# Patient Record
Sex: Female | Born: 2000
Health system: Southern US, Community
[De-identification: ages and names within clinical notes are randomized; demographics above are authoritative.]

## PROBLEM LIST (undated history)

## (undated) DIAGNOSIS — L309 Dermatitis, unspecified: Secondary | ICD-10-CM

## (undated) DIAGNOSIS — T7840XA Allergy, unspecified, initial encounter: Secondary | ICD-10-CM

## (undated) HISTORY — DX: Dermatitis, unspecified: L30.9

## (undated) HISTORY — DX: Allergy, unspecified, initial encounter: T78.40XA

---
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
## 2022-01-12 ENCOUNTER — Other Ambulatory Visit (HOSPITAL_COMMUNITY): Payer: Self-pay

## 2022-01-16 DIAGNOSIS — Z419 Encounter for procedure for purposes other than remedying health state, unspecified: Secondary | ICD-10-CM | POA: Diagnosis not present

## 2022-02-16 DIAGNOSIS — Z419 Encounter for procedure for purposes other than remedying health state, unspecified: Secondary | ICD-10-CM | POA: Diagnosis not present

## 2022-03-19 DIAGNOSIS — Z419 Encounter for procedure for purposes other than remedying health state, unspecified: Secondary | ICD-10-CM | POA: Diagnosis not present

## 2022-04-18 DIAGNOSIS — Z419 Encounter for procedure for purposes other than remedying health state, unspecified: Secondary | ICD-10-CM | POA: Diagnosis not present

## 2022-05-19 DIAGNOSIS — Z419 Encounter for procedure for purposes other than remedying health state, unspecified: Secondary | ICD-10-CM | POA: Diagnosis not present

## 2022-06-02 ENCOUNTER — Other Ambulatory Visit: Payer: Self-pay

## 2022-06-02 ENCOUNTER — Encounter (HOSPITAL_BASED_OUTPATIENT_CLINIC_OR_DEPARTMENT_OTHER): Payer: Self-pay

## 2022-06-02 ENCOUNTER — Other Ambulatory Visit (HOSPITAL_COMMUNITY): Payer: Self-pay

## 2022-06-02 ENCOUNTER — Emergency Department (HOSPITAL_BASED_OUTPATIENT_CLINIC_OR_DEPARTMENT_OTHER)
Admission: EM | Admit: 2022-06-02 | Discharge: 2022-06-02 | Disposition: A | Discharge status: Home / Self Care | Payer: Medicaid Other | Attending: Emergency Medicine | Admitting: Emergency Medicine

## 2022-06-02 DIAGNOSIS — N760 Acute vaginitis: Secondary | ICD-10-CM | POA: Insufficient documentation

## 2022-06-02 DIAGNOSIS — Z7951 Long term (current) use of inhaled steroids: Secondary | ICD-10-CM | POA: Diagnosis not present

## 2022-06-02 DIAGNOSIS — J45909 Unspecified asthma, uncomplicated: Secondary | ICD-10-CM | POA: Insufficient documentation

## 2022-06-02 DIAGNOSIS — N898 Other specified noninflammatory disorders of vagina: Secondary | ICD-10-CM | POA: Diagnosis present

## 2022-06-02 DIAGNOSIS — B9689 Other specified bacterial agents as the cause of diseases classified elsewhere: Secondary | ICD-10-CM | POA: Diagnosis not present

## 2022-06-02 LAB — WET PREP, GENITAL
Sperm: NONE SEEN
Trich, Wet Prep: NONE SEEN
WBC, Wet Prep HPF POC: >=10 — AB (ref <10)
Yeast Wet Prep HPF POC: NONE SEEN

## 2022-06-02 LAB — URINALYSIS, ROUTINE W REFLEX MICROSCOPIC
Bilirubin Urine: NEGATIVE
Glucose, UA: NEGATIVE mg/dL
Hgb urine dipstick: NEGATIVE
Ketones, ur: NEGATIVE mg/dL
Leukocytes,Ua: NEGATIVE
Nitrite: NEGATIVE
Protein, ur: NEGATIVE mg/dL
Specific Gravity, Urine: 1.025 (ref 1.005–1.030)
pH: 7 (ref 5.0–8.0)

## 2022-06-02 LAB — PREGNANCY, URINE: Preg Test, Ur: NEGATIVE

## 2022-06-02 MED ORDER — METRONIDAZOLE 500 MG PO TABS
500.0000 mg | ORAL_TABLET | Freq: Two times a day (BID) | ORAL | 0 refills | Status: AC
  Filled 2022-06-02: qty 14, 7d supply, fill #0

## 2022-06-02 NOTE — ED Triage Notes (Signed)
Pt reports brown vaginal discharge x 1 week

## 2022-06-02 NOTE — Discharge Instructions (Addendum)
You were seen in the emergency department today for vaginal discharge.  Your urine sample did not show any infection.  However you did test positive for bacterial vaginosis.  This is a very common infection, and it is not sexually transmitted.  It is treated with antibiotics, which I sent to your pharmacy.  Continue to monitor how you're doing and return to the ER for new or worsening symptoms.

## 2022-06-02 NOTE — ED Notes (Signed)
Patient self swabbed

## 2022-06-02 NOTE — ED Provider Notes (Signed)
[facility] EMERGENCY DEPARTMENT Provider Note   CSN: [id number] Arrival date & time: 06/02/22  1125     History  Chief Complaint  Patient presents with   Vaginal Discharge    Sydney Holland is a 21 y.o. adult with history of asthma who presents the emergency department complaining of vaginal discharge x2 weeks.  Patient states that the discharge is brown and thick.  Last menstrual period ended 8 days ago and was normal for them.  Describes some itching, but no vaginal pain, pelvic pain, or abdominal pain.  No urinary symptoms.   Vaginal Discharge      Home Medications Prior to Admission medications   Medication Sig Start Date End Date Taking? Authorizing Provider  metroNIDAZOLE (FLAGYL) 500 MG tablet Take 1 tablet (500 mg total) by mouth 2 (two) times daily. 06/02/22  Yes Kailin Leu T, PA-C  cetirizine (ZYRTEC) 10 MG tablet Take 1 tablet (10 mg total) by mouth at bedtime for allergies 10/17/21   Lucio Edward, MD  ferrous sulfate 325 (65 FE) MG tablet TAKE 1 TABLET BY MOUTH ONCE A DAY 07/17/20 07/17/21  Lucio Edward, MD  fluticasone (FLONASE) 50 MCG/ACT nasal spray Place 2 sprays into the nose daily as needed. For allergies     [provider]  Iron, Ferrous Sulfate, 325 (65 Fe) MG TABS 1 tab by mouth once a day. 07/17/20   Lucio Edward, MD  montelukast (SINGULAIR) 10 MG tablet TAKE 1 TABLET BY MOUTH AT BEDTIME 08/18/20 08/18/21  Lucio Edward, MD  PAZEO 0.7 % SOLN INSTILL 1 DROP IN AFFECTED EYE(S) EVERY DAY AS NEEDED FOR ITCHING 02/26/19   Lucio Edward, MD  PROAIR HFA [phone] Base) MCG/ACT inhaler INHALE 2 PUFFS BY MOUTH EVERY 4 TO 6 HOURS AS NEEDED FOR WHEEZING 08/18/20 08/18/21  Lucio Edward, MD      Allergies    Other, Azithromycin, and Penicillins    Review of Systems   Review of Systems  Genitourinary:  Positive for vaginal discharge.  All other systems reviewed and are negative.   Physical Exam Updated Vital Signs BP 111/62 (BP  Location: Right Arm)   Pulse 82   Temp 98.4 F (36.9 C) (Oral)   Resp 16   Ht 5' (1.524 m)   Wt 56 kg   LMP 05/26/2022   SpO2 100%   BMI 24.10 kg/m  Physical Exam Vitals and nursing note reviewed.  Constitutional:      Appearance: Normal appearance.  HENT:     Head: Normocephalic and atraumatic.  Eyes:     Conjunctiva/sclera: Conjunctivae normal.  Pulmonary:     Effort: Pulmonary effort is normal. No respiratory distress.  Skin:    General: Skin is warm and dry.  Neurological:     Mental Status: Damoni Erker is alert.  Psychiatric:        Mood and Affect: Mood normal.        Behavior: Behavior normal.     ED Results / Procedures / Treatments   Labs (all labs ordered are listed, but only abnormal results are displayed) Labs Reviewed  WET PREP, GENITAL - Abnormal; Notable for the following components:      Result Value   Clue Cells Wet Prep HPF POC PRESENT (*)    WBC, Wet Prep HPF POC >=10 (*)    All other components within normal limits  URINALYSIS, ROUTINE W REFLEX MICROSCOPIC  PREGNANCY, URINE  GC/CHLAMYDIA PROBE AMP ([place]) NOT AT [facility]    EKG None  Radiology No results found.  Procedures Procedures    Medications Ordered in ED Medications - No data to display  ED Course/ Medical Decision Making/ A&P                           Medical Decision Making Amount and/or Complexity of Data Reviewed Labs: ordered.  Risk Prescription drug management.   This patient is a 21 y.o. adult  who presents to the ED for concern of vaginal discharge.   Differential diagnoses prior to evaluation: The emergent differential diagnosis includes, but is not limited to,  abnormal uterine bleeding, miscarriage, bleeding in early pregnancy, ectopic pregnancy, trauma, bacterial vaginosis, yeast, STI. This is not an exhaustive differential.   Past Medical History / Co-morbidities: Asthma, pt identifies as non-binary  Physical Exam: Physical exam performed. The  pertinent findings include: Normal vital signs. Patient deferred GU exam and elected to self-swab.   Lab Tests/Imaging studies: I personally interpreted labs/imaging and the pertinent results include:  wet prep positive for clue cells, indicative of bacteria vaginosis.   Disposition: After consideration of the diagnostic results and the patients response to treatment, I feel that emergency department workup does not suggest an emergent condition requiring admission or immediate intervention beyond what has been performed at this time. The plan is: discharge to home with antibiotics for bacterial vaginosis. The patient is safe for discharge and has been instructed to return immediately for worsening symptoms, change in symptoms or any other concerns.  Final Clinical Impression(s) / ED Diagnoses Final diagnoses:  BV (bacterial vaginosis)    Rx / DC Orders ED Discharge Orders          Ordered    metroNIDAZOLE (FLAGYL) 500 MG tablet  2 times daily        06/02/22 1409           Portions of this report may have been transcribed using voice recognition software. Every effort was made to ensure accuracy; however, inadvertent computerized transcription errors may be present.    Estill Cotta 06/02/22 1422    Gareth Morgan, MD 06/02/22 2249

## 2022-06-03 LAB — GC/CHLAMYDIA PROBE AMP (~~LOC~~) NOT AT ARMC
Chlamydia: NEGATIVE
Comment: NEGATIVE
Comment: NORMAL
Neisseria Gonorrhea: NEGATIVE

## 2022-06-18 DIAGNOSIS — Z419 Encounter for procedure for purposes other than remedying health state, unspecified: Secondary | ICD-10-CM | POA: Diagnosis not present

## 2022-07-19 DIAGNOSIS — Z419 Encounter for procedure for purposes other than remedying health state, unspecified: Secondary | ICD-10-CM | POA: Diagnosis not present

## 2022-08-19 DIAGNOSIS — Z419 Encounter for procedure for purposes other than remedying health state, unspecified: Secondary | ICD-10-CM | POA: Diagnosis not present

## 2022-09-17 DIAGNOSIS — Z419 Encounter for procedure for purposes other than remedying health state, unspecified: Secondary | ICD-10-CM | POA: Diagnosis not present

## 2022-10-18 DIAGNOSIS — Z419 Encounter for procedure for purposes other than remedying health state, unspecified: Secondary | ICD-10-CM | POA: Diagnosis not present

## 2022-11-03 ENCOUNTER — Encounter (HOSPITAL_BASED_OUTPATIENT_CLINIC_OR_DEPARTMENT_OTHER): Payer: Self-pay | Admitting: Pediatrics

## 2022-11-03 ENCOUNTER — Other Ambulatory Visit: Payer: Self-pay

## 2022-11-03 ENCOUNTER — Emergency Department (HOSPITAL_BASED_OUTPATIENT_CLINIC_OR_DEPARTMENT_OTHER)
Admission: EM | Admit: 2022-11-03 | Discharge: 2022-11-03 | Disposition: A | Discharge status: Home / Self Care | Payer: Medicaid Other | Attending: Emergency Medicine | Admitting: Emergency Medicine

## 2022-11-03 DIAGNOSIS — B9789 Other viral agents as the cause of diseases classified elsewhere: Secondary | ICD-10-CM | POA: Insufficient documentation

## 2022-11-03 DIAGNOSIS — J028 Acute pharyngitis due to other specified organisms: Secondary | ICD-10-CM | POA: Insufficient documentation

## 2022-11-03 DIAGNOSIS — Z20822 Contact with and (suspected) exposure to covid-19: Secondary | ICD-10-CM | POA: Diagnosis not present

## 2022-11-03 DIAGNOSIS — J029 Acute pharyngitis, unspecified: Secondary | ICD-10-CM | POA: Diagnosis present

## 2022-11-03 LAB — RESP PANEL BY RT-PCR (RSV, FLU A&B, COVID)  RVPGX2
Influenza A by PCR: NEGATIVE
Influenza B by PCR: NEGATIVE
Resp Syncytial Virus by PCR: NEGATIVE
SARS Coronavirus 2 by RT PCR: NEGATIVE

## 2022-11-03 LAB — GROUP A STREP BY PCR: Group A Strep by PCR: NOT DETECTED

## 2022-11-03 MED ORDER — DEXAMETHASONE 4 MG PO TABS
6.0000 mg | ORAL_TABLET | Freq: Once | ORAL | Status: AC
  Administered 2022-11-03: 6 mg via ORAL
  Filled 2022-11-03: qty 2

## 2022-11-03 MED ORDER — ACETAMINOPHEN 500 MG PO TABS
1000.0000 mg | ORAL_TABLET | Freq: Four times a day (QID) | ORAL | Status: DC | PRN
  Administered 2022-11-03: 1000 mg via ORAL
  Filled 2022-11-03: qty 2

## 2022-11-03 MED ORDER — KETOROLAC TROMETHAMINE 60 MG/2ML IM SOLN
15.0000 mg | Freq: Once | INTRAMUSCULAR | Status: AC
  Administered 2022-11-03: 15 mg via INTRAMUSCULAR
  Filled 2022-11-03: qty 2

## 2022-11-03 NOTE — ED Notes (Signed)
Pt discharged to home, NAD noted at discharge 

## 2022-11-03 NOTE — ED Provider Notes (Signed)
[place] EMERGENCY DEPARTMENT AT [facility] Provider Note   CSN: [id number] Arrival date & time: 11/03/22  1722     History  Chief Complaint  Patient presents with   Otalgia   Sore Throat    Sydney Holland is a 22 y.o. adult who presents to emergency room complaining of sore throat and ear pain. Sore throat presented 4/14 and ear started hurting on 4/16. Patient also endorses feeling warm, but did not take temperature at home. Denies feeling throat fullness/choking. Able to tolerate PO fluids. Denies nausea, vomiting, rhinorrhea, congestion, headache, chest pain, choking, cough, dyspnea, abdominal pain.    Otalgia Sore Throat       Home Medications Prior to Admission medications   Medication Sig Start Date End Date Taking? Authorizing Provider  cetirizine (ZYRTEC) 10 MG tablet Take 1 tablet (10 mg total) by mouth at bedtime for allergies 10/17/21   Lucio Edward, MD  ferrous sulfate 325 (65 FE) MG tablet TAKE 1 TABLET BY MOUTH ONCE A DAY 07/17/20 07/17/21  Lucio Edward, MD  fluticasone (FLONASE) 50 MCG/ACT nasal spray Place 2 sprays into the nose daily as needed. For allergies     [provider]  Iron, Ferrous Sulfate, 325 (65 Fe) MG TABS 1 tab by mouth once a day. 07/17/20   Lucio Edward, MD  metroNIDAZOLE (FLAGYL) 500 MG tablet Take 1 tablet (500 mg total) by mouth 2 (two) times daily. 06/02/22   Roemhildt, Lorin T, PA-C  montelukast (SINGULAIR) 10 MG tablet TAKE 1 TABLET BY MOUTH AT BEDTIME 08/18/20 08/18/21  Lucio Edward, MD  PAZEO 0.7 % SOLN INSTILL 1 DROP IN AFFECTED EYE(S) EVERY DAY AS NEEDED FOR ITCHING 02/26/19   Lucio Edward, MD  PROAIR HFA [phone] Base) MCG/ACT inhaler INHALE 2 PUFFS BY MOUTH EVERY 4 TO 6 HOURS AS NEEDED FOR WHEEZING 08/18/20 08/18/21  Lucio Edward, MD      Allergies    Other, Azithromycin, and Penicillins    Review of Systems   Review of Systems  HENT:  Positive for ear pain.     Physical Exam Updated Vital  Signs BP 110/68 (BP Location: Left Arm)   Pulse (!) 105   Temp (!) 100.8 F (38.2 C)   Resp 18   Ht 5\' 1"  (1.549 m)   Wt 54.4 kg   LMP 10/18/2022 (Exact Date)   SpO2 100%   BMI 22.67 kg/m  Physical Exam Vitals and nursing note reviewed.  Constitutional:      General: Sydney Holland is not in acute distress.    Appearance: Sydney Holland is not ill-appearing or toxic-appearing.  HENT:     Head: Normocephalic and atraumatic.     Right Ear: Tympanic membrane and ear canal normal. No drainage or swelling. Tympanic membrane is not erythematous.     Left Ear: Tympanic membrane and ear canal normal. No drainage or swelling. Tympanic membrane is not erythematous.     Mouth/Throat:     Mouth: Mucous membranes are moist.     Pharynx: Uvula midline. Pharyngeal swelling and posterior oropharyngeal erythema present. No oropharyngeal exudate or uvula swelling.     Tonsils: No tonsillar exudate or tonsillar abscesses.  Eyes:     General: No scleral icterus.       Right eye: No discharge.        Left eye: No discharge.     Conjunctiva/sclera: Conjunctivae normal.  Cardiovascular:     Rate and Rhythm: Normal rate.     Pulses: Normal  pulses.     Heart sounds: Normal heart sounds. No murmur heard. Pulmonary:     Effort: Pulmonary effort is normal. No respiratory distress.     Breath sounds: Normal breath sounds. No wheezing, rhonchi or rales.  Abdominal:     General: Bowel sounds are normal.     Palpations: Abdomen is soft.  Musculoskeletal:     Cervical back: Normal range of motion and neck supple.     Right lower leg: No edema.     Left lower leg: No edema.  Lymphadenopathy:     Cervical: No cervical adenopathy.  Skin:    General: Skin is warm and dry.     Findings: No rash.  Neurological:     General: No focal deficit present.     Mental Status: Sydney Holland is alert. Mental status is at baseline.  Psychiatric:        Mood and Affect: Mood normal.     ED Results /  Procedures / Treatments   Labs (all labs ordered are listed, but only abnormal results are displayed) Labs Reviewed  GROUP A STREP BY PCR  RESP PANEL BY RT-PCR (RSV, FLU A&B, COVID)  [id number]    EKG None  Radiology No results found.  Procedures Procedures    Medications Ordered in ED Medications  ketorolac (TORADOL) injection 15 mg (has no administration in time range)  dexamethasone (DECADRON) tablet 6 mg (has no administration in time range)  acetaminophen (TYLENOL) tablet 1,000 mg (has no administration in time range)    ED Course/ Medical Decision Making/ A&P Clinical Course as of 11/03/22 1919  Wed Nov 03, 2022  1905 Stable 21 YOF with fever/ear/sorethroat  HDS no acute distress Tylenol/Toradol/dexamethasone DC and follow up with PCP [CC]    Clinical Course User Index [CC] [name], MD                             Medical Decision Making  This patient presents to the ED for concern of fever/cough/rhinorrhea/congestion, this involves an extensive number of treatment options, and is a complaint that carries with it a high risk of complications and morbidity.  The differential diagnosis includes URI, COVID-19, otitis media, meningitis, Strep pharyngitis.   Co morbidities that complicate the patient evaluation  none   Additional history obtained:  none   Lab Tests:  I Ordered, and personally interpreted labs.  The pertinent results include:   Respiratory Panel:negative Strep PCR: negative   Problem List / ED Course / Critical interventions / Medication management  Patient presents to ED complaining of sore throat and bilateral ear pain. Physical exam unremarkable except for bilateral tonsillar swelling and erythema. Vital reassuring. Respiratory panel negative. Strep PCR negative. Given tests and physical exam, I am most concerned of viral pharyngitis at this time. Patient endorses she is not pregnant with LMP 10/18/2022. I am giving her treatment of  Toradol and decadron in ED, reassess, and discharge patient if she is still stable. Recommended patient follow up with PCP. Patient was given return precautions. Patient stable for discharge at this time.  Patient verbalized understanding of plan.   DDx: These are considered less likely due to history of present illness and physical exam findings Meningitis: patient's symptoms, vital signs, physical exam findings including lack of meningismus seem grossly less consistent at this time COVID: respiratory panel negative AOM: tympanic membranes opalescent and nonbulging    Social Determinants of Health:  none  Final Clinical Impression(s) / ED Diagnoses Final diagnoses:  Viral pharyngitis    Rx / DC Orders ED Discharge Orders     None         Dorthy Cooler, [place] 11/03/22 Verneda Skill, MD 11/04/22 1500

## 2022-11-03 NOTE — Discharge Instructions (Addendum)
It was a pleasure taking care of you today. Swabs were negative for strep, COVID and Flu. I am giving you medicines here in ED which should help symptoms resolve. Seek emergency care if experiencing new or worsening symptoms.

## 2022-11-03 NOTE — ED Triage Notes (Signed)
C/O sore throat and bilateral ear pain

## 2022-11-05 ENCOUNTER — Emergency Department (HOSPITAL_BASED_OUTPATIENT_CLINIC_OR_DEPARTMENT_OTHER)
Admission: EM | Admit: 2022-11-05 | Discharge: 2022-11-05 | Disposition: A | Discharge status: Home / Self Care | Payer: Medicaid Other | Attending: Emergency Medicine | Admitting: Emergency Medicine

## 2022-11-05 ENCOUNTER — Encounter (HOSPITAL_BASED_OUTPATIENT_CLINIC_OR_DEPARTMENT_OTHER): Payer: Self-pay | Admitting: Emergency Medicine

## 2022-11-05 ENCOUNTER — Other Ambulatory Visit: Payer: Self-pay

## 2022-11-05 DIAGNOSIS — B3731 Acute candidiasis of vulva and vagina: Secondary | ICD-10-CM | POA: Insufficient documentation

## 2022-11-05 DIAGNOSIS — N898 Other specified noninflammatory disorders of vagina: Secondary | ICD-10-CM | POA: Diagnosis not present

## 2022-11-05 LAB — URINALYSIS, ROUTINE W REFLEX MICROSCOPIC
Bilirubin Urine: NEGATIVE
Glucose, UA: NEGATIVE mg/dL
Hgb urine dipstick: NEGATIVE
Ketones, ur: NEGATIVE mg/dL
Nitrite: NEGATIVE
Protein, ur: 30 mg/dL — AB
Specific Gravity, Urine: 1.02 (ref 1.005–1.030)
pH: 7 (ref 5.0–8.0)

## 2022-11-05 LAB — URINALYSIS, MICROSCOPIC (REFLEX)

## 2022-11-05 LAB — PREGNANCY, URINE: Preg Test, Ur: NEGATIVE

## 2022-11-05 LAB — WET PREP, GENITAL
Clue Cells Wet Prep HPF POC: NONE SEEN
Sperm: NONE SEEN
Trich, Wet Prep: NONE SEEN
WBC, Wet Prep HPF POC: >=10 — AB (ref <10)

## 2022-11-05 MED ORDER — FLUCONAZOLE 150 MG PO TABS
ORAL_TABLET | ORAL | 0 refills | Status: AC
  Filled 2022-11-05: qty 2, 1d supply, fill #0
  Filled 2022-11-30: qty 2, 3d supply, fill #0

## 2022-11-05 NOTE — Discharge Instructions (Signed)
You were seen in the emergency department for vaginal discharge. Based on our labs, it appears that you have a yeast infection present. I have prescribed you Diflucan which you can take to clear up your symptoms.

## 2022-11-05 NOTE — ED Provider Notes (Signed)
[facility] Provider Note   CSN: [id number] Arrival date & time: 11/05/22  2022     History Chief Complaint  Patient presents with   Vaginal Discharge    Sydney Holland is a 22 y.o. adult.  Patient presents emergency department complaints of vaginal discharge.  She reports that this vaginal discharge has been present for the last few days.  Denies any risk.  New sexual partners or behaviors.  States that she is contraceptive with partner.  Denies any urinary symptoms such as dysuria, hematuria, increased urinary frequency or urgency.  Last menstrual period on 10/18/2022.  Only true symptom is vaginal itching but denies any obvious vaginal odor or vaginal changes.   Vaginal Discharge      Home Medications Prior to Admission medications   Medication Sig Start Date End Date Taking? Authorizing Provider  fluconazole (DIFLUCAN) 150 MG tablet Take 1 tablet on day 1. If symptoms persist, you may repeat the second dose on day 3. 11/05/22  Yes Lonnetta Kniskern, Barnetta Chapel, PA-C  cetirizine (ZYRTEC) 10 MG tablet Take 1 tablet (10 mg total) by mouth at bedtime for allergies 10/17/21   Lucio Edward, MD  ferrous sulfate 325 (65 FE) MG tablet TAKE 1 TABLET BY MOUTH ONCE A DAY 07/17/20 07/17/21  Lucio Edward, MD  fluticasone (FLONASE) 50 MCG/ACT nasal spray Place 2 sprays into the nose daily as needed. For allergies     [provider]  Iron, Ferrous Sulfate, 325 (65 Fe) MG TABS 1 tab by mouth once a day. 07/17/20   Lucio Edward, MD  metroNIDAZOLE (FLAGYL) 500 MG tablet Take 1 tablet (500 mg total) by mouth 2 (two) times daily. 06/02/22   Roemhildt, Lorin T, PA-C  montelukast (SINGULAIR) 10 MG tablet TAKE 1 TABLET BY MOUTH AT BEDTIME 08/18/20 08/18/21  Lucio Edward, MD  PAZEO 0.7 % SOLN INSTILL 1 DROP IN AFFECTED EYE(S) EVERY DAY AS NEEDED FOR ITCHING 02/26/19   Lucio Edward, MD  PROAIR HFA 108 [phone] Base) MCG/ACT inhaler INHALE 2 PUFFS BY MOUTH EVERY  4 TO 6 HOURS AS NEEDED FOR WHEEZING 08/18/20 08/18/21  Lucio Edward, MD      Allergies    Other, Azithromycin, and Penicillins    Review of Systems   Review of Systems  Genitourinary:  Positive for vaginal discharge.  All other systems reviewed and are negative.   Physical Exam Updated Vital Signs BP 104/65 (BP Location: Right Arm)   Pulse 94   Temp 98.8 F (37.1 C) (Oral)   Resp 16   Ht  (1.549 m)   Wt 54.4 kg   LMP 10/18/2022 (Exact Date)   SpO2 100%   BMI 22.67 kg/m  Physical Exam Vitals and nursing note reviewed.  Constitutional:      General: [name] is not in acute distress.    Appearance: [name] is well-developed.  HENT:     Head: Normocephalic and atraumatic.  Eyes:     Conjunctiva/sclera: Conjunctivae normal.  Cardiovascular:     Rate and Rhythm: Normal rate and regular rhythm.     Heart sounds: No murmur heard. Pulmonary:     Effort: Pulmonary effort is normal. No respiratory distress.     Breath sounds: Normal breath sounds.  Abdominal:     Palpations: Abdomen is soft.     Tenderness: There is no abdominal tenderness.  Genitourinary:    Comments: Patient declined pelvic exam Musculoskeletal:        General: No  swelling.     Cervical back: Neck supple.  Skin:    General: Skin is warm and dry.     Capillary Refill: Capillary refill takes less than 2 seconds.  Neurological:     Mental Status: [name] is alert.  Psychiatric:        Mood and Affect: Mood normal.     ED Results / Procedures / Treatments   Labs (all labs ordered are listed, but only abnormal results are displayed) Labs Reviewed  WET PREP, GENITAL - Abnormal; Notable for the following components:      Result Value   Yeast Wet Prep HPF POC PRESENT (*)    WBC, Wet Prep HPF POC >=10 (*)    All other components within normal limits  URINALYSIS, ROUTINE W REFLEX MICROSCOPIC - Abnormal; Notable for the following components:   Protein, ur 30 (*)     Leukocytes,Ua SMALL (*)    All other components within normal limits  URINALYSIS, MICROSCOPIC (REFLEX) - Abnormal; Notable for the following components:   Bacteria, UA FEW (*)    All other components within normal limits  PREGNANCY, URINE  GC/CHLAMYDIA PROBE AMP ([place]) NOT AT [facility]    EKG None  Radiology No results found.  Procedures Procedures   Medications Ordered in ED Medications - No data to display  ED Course/ Medical Decision Making/ A&P                           Medical Decision Making Amount and/or Complexity of Data Reviewed Labs: ordered.  Risk Prescription drug management.   This patient presents to the ED for concern of vaginal discharge.  Differential diagnosis includes UTI, yeast infection, bacterial vaginosis, gonorrhea, chlamydia   Lab Tests:  I Ordered, and personally interpreted labs.  The pertinent results include: UA with possible evidence of UTI with leukocytes and bacteria present, urine pregnancy negative, urine prep positive for yeast, negative for trichomonas and clue cells  Problem List / ED Course:  Patient presents emergency department complaints of vaginal discharge.  Patient reports minimal concern at this time for any STIs.  She states that the vaginal discharge is benign and is a white color nature with no obvious foul discharge or smell noted.  No prior history of any yeast infection or BV.  States that she has not had any new sexual partners recently.  Based on presentation, urinalysis negative for infection but wet prep did show signs of a yeast infection.  Advised patient of findings and patient is agreeable with plan to treat this.  Offered patient treatment.  The emergency department patient prefers outpatient treatment.  Will send 2 tablets of Diflucan to patient's pharmacy with instructions to take first dose on day 1 and then repeat second dose 3 days later if symptoms not improving.  Patient agreeable to treatment plan  verbalized understanding return precautions.  All questions answered prior to patient discharge.  Final Clinical Impression(s) / ED Diagnoses Final diagnoses:  Vaginal yeast infection    Rx / DC Orders ED Discharge Orders          Ordered    fluconazole (DIFLUCAN) 150 MG tablet        11/05/22 2313              Smitty Knudsen, PA-C 11/05/22 2319    Maia Plan, MD 11/07/22 1130

## 2022-11-05 NOTE — ED Triage Notes (Signed)
Patient arrived via POV c/o vaginal itching and white discharge. Patient is AO x 4, VS WDL, normal gait.

## 2022-11-06 ENCOUNTER — Other Ambulatory Visit (HOSPITAL_COMMUNITY): Payer: Self-pay

## 2022-11-08 LAB — GC/CHLAMYDIA PROBE AMP (~~LOC~~) NOT AT ARMC
Chlamydia: NEGATIVE
Comment: NEGATIVE
Comment: NORMAL
Neisseria Gonorrhea: NEGATIVE

## 2022-11-10 ENCOUNTER — Other Ambulatory Visit (HOSPITAL_COMMUNITY): Payer: Self-pay

## 2022-11-11 ENCOUNTER — Other Ambulatory Visit (HOSPITAL_COMMUNITY): Payer: Self-pay

## 2022-11-11 ENCOUNTER — Other Ambulatory Visit: Payer: Self-pay

## 2022-11-12 ENCOUNTER — Other Ambulatory Visit (HOSPITAL_COMMUNITY): Payer: Self-pay

## 2022-11-15 ENCOUNTER — Other Ambulatory Visit: Payer: Self-pay

## 2022-11-15 ENCOUNTER — Other Ambulatory Visit (HOSPITAL_COMMUNITY): Payer: Self-pay

## 2022-11-16 ENCOUNTER — Other Ambulatory Visit: Payer: Self-pay

## 2022-11-17 ENCOUNTER — Other Ambulatory Visit: Payer: Self-pay

## 2022-11-17 DIAGNOSIS — Z419 Encounter for procedure for purposes other than remedying health state, unspecified: Secondary | ICD-10-CM | POA: Diagnosis not present

## 2022-11-18 ENCOUNTER — Other Ambulatory Visit: Payer: Self-pay

## 2022-11-18 ENCOUNTER — Other Ambulatory Visit (HOSPITAL_COMMUNITY): Payer: Self-pay

## 2022-11-19 ENCOUNTER — Other Ambulatory Visit: Payer: Self-pay

## 2022-11-19 ENCOUNTER — Other Ambulatory Visit (HOSPITAL_COMMUNITY): Payer: Self-pay

## 2022-11-19 ENCOUNTER — Encounter (HOSPITAL_COMMUNITY): Payer: Self-pay

## 2022-11-23 ENCOUNTER — Other Ambulatory Visit: Payer: Self-pay

## 2022-11-30 ENCOUNTER — Other Ambulatory Visit (HOSPITAL_COMMUNITY): Payer: Self-pay

## 2022-11-30 ENCOUNTER — Other Ambulatory Visit: Payer: Self-pay

## 2022-12-18 DIAGNOSIS — Z419 Encounter for procedure for purposes other than remedying health state, unspecified: Secondary | ICD-10-CM | POA: Diagnosis not present

## 2023-01-17 DIAGNOSIS — Z419 Encounter for procedure for purposes other than remedying health state, unspecified: Secondary | ICD-10-CM | POA: Diagnosis not present

## 2023-02-17 DIAGNOSIS — Z419 Encounter for procedure for purposes other than remedying health state, unspecified: Secondary | ICD-10-CM | POA: Diagnosis not present

## 2023-03-20 DIAGNOSIS — Z419 Encounter for procedure for purposes other than remedying health state, unspecified: Secondary | ICD-10-CM | POA: Diagnosis not present

## 2023-03-31 ENCOUNTER — Encounter: Payer: Self-pay | Admitting: *Deleted

## 2023-04-19 DIAGNOSIS — Z419 Encounter for procedure for purposes other than remedying health state, unspecified: Secondary | ICD-10-CM | POA: Diagnosis not present

## 2023-05-20 DIAGNOSIS — Z419 Encounter for procedure for purposes other than remedying health state, unspecified: Secondary | ICD-10-CM | POA: Diagnosis not present

## 2023-06-19 DIAGNOSIS — Z419 Encounter for procedure for purposes other than remedying health state, unspecified: Secondary | ICD-10-CM | POA: Diagnosis not present

## 2023-07-20 DIAGNOSIS — Z419 Encounter for procedure for purposes other than remedying health state, unspecified: Secondary | ICD-10-CM | POA: Diagnosis not present

## 2023-08-20 DIAGNOSIS — Z419 Encounter for procedure for purposes other than remedying health state, unspecified: Secondary | ICD-10-CM | POA: Diagnosis not present

## 2023-09-17 DIAGNOSIS — Z419 Encounter for procedure for purposes other than remedying health state, unspecified: Secondary | ICD-10-CM | POA: Diagnosis not present

## 2023-10-29 DIAGNOSIS — Z419 Encounter for procedure for purposes other than remedying health state, unspecified: Secondary | ICD-10-CM | POA: Diagnosis not present

## 2023-11-28 DIAGNOSIS — Z419 Encounter for procedure for purposes other than remedying health state, unspecified: Secondary | ICD-10-CM | POA: Diagnosis not present

## 2023-12-29 DIAGNOSIS — Z419 Encounter for procedure for purposes other than remedying health state, unspecified: Secondary | ICD-10-CM | POA: Diagnosis not present

## 2024-01-28 DIAGNOSIS — Z419 Encounter for procedure for purposes other than remedying health state, unspecified: Secondary | ICD-10-CM | POA: Diagnosis not present

## 2024-02-28 DIAGNOSIS — Z419 Encounter for procedure for purposes other than remedying health state, unspecified: Secondary | ICD-10-CM | POA: Diagnosis not present

## 2024-04-06 ENCOUNTER — Encounter: Payer: Self-pay | Admitting: *Deleted
